# Patient Record
Sex: Male | Born: 1985 | Race: White | Hispanic: No | Marital: Married
Health system: Southern US, Community
[De-identification: ages and names within clinical notes are randomized; demographics above are authoritative.]

---

## 2007-08-20 ENCOUNTER — Ambulatory Visit: Payer: Self-pay | Admitting: Family Medicine

## 2009-06-16 ENCOUNTER — Emergency Department: Payer: Self-pay | Admitting: Emergency Medicine

## 2013-01-03 ENCOUNTER — Ambulatory Visit: Payer: Self-pay | Admitting: Family Medicine

## 2013-04-27 ENCOUNTER — Ambulatory Visit: Payer: Self-pay | Admitting: Physician Assistant

## 2013-04-27 LAB — COMPREHENSIVE METABOLIC PANEL
ALT: 159 U/L — AB (ref 12–78)
Albumin: 3.9 g/dL (ref 3.4–5.0)
Alkaline Phosphatase: 163 U/L — ABNORMAL HIGH
Anion Gap: 6 — ABNORMAL LOW (ref 7–16)
BUN: 16 mg/dL (ref 7–18)
Bilirubin,Total: 0.5 mg/dL (ref 0.2–1.0)
CALCIUM: 9.6 mg/dL (ref 8.5–10.1)
Chloride: 103 mmol/L (ref 98–107)
Co2: 31 mmol/L (ref 21–32)
Creatinine: 1.02 mg/dL (ref 0.60–1.30)
EGFR (African American): >60
Glucose: 103 mg/dL — ABNORMAL HIGH (ref 65–99)
Osmolality: 281 (ref 275–301)
Potassium: 4.4 mmol/L (ref 3.5–5.1)
SGOT(AST): 71 U/L — ABNORMAL HIGH (ref 15–37)
Sodium: 140 mmol/L (ref 136–145)
TOTAL PROTEIN: 8 g/dL (ref 6.4–8.2)

## 2013-04-27 LAB — CBC WITH DIFFERENTIAL/PLATELET
BASOS ABS: 0.1 10*3/uL (ref 0.0–0.1)
Basophil %: 1 %
Eosinophil #: 0.2 10*3/uL (ref 0.0–0.7)
Eosinophil %: 2.5 %
HCT: 43 % (ref 40.0–52.0)
HGB: 14.4 g/dL (ref 13.0–18.0)
Lymphocyte #: 0.9 10*3/uL — ABNORMAL LOW (ref 1.0–3.6)
Lymphocyte %: 14.5 %
MCH: 27.4 pg (ref 26.0–34.0)
MCHC: 33.5 g/dL (ref 32.0–36.0)
MCV: 82 fL (ref 80–100)
MONO ABS: 0.7 x10 3/mm (ref 0.2–1.0)
Monocyte %: 10.7 %
NEUTROS PCT: 71.3 %
Neutrophil #: 4.6 10*3/uL (ref 1.4–6.5)
PLATELETS: 270 10*3/uL (ref 150–440)
RBC: 5.26 10*6/uL (ref 4.40–5.90)
RDW: 13.8 % (ref 11.5–14.5)
WBC: 6.5 10*3/uL (ref 3.8–10.6)

## 2013-04-27 LAB — LIPASE, BLOOD: Lipase: 126 U/L (ref 73–393)

## 2013-04-27 LAB — AMYLASE: Amylase: 64 U/L (ref 25–115)

## 2013-05-03 ENCOUNTER — Ambulatory Visit: Payer: Self-pay | Admitting: Surgery

## 2013-05-04 ENCOUNTER — Ambulatory Visit: Payer: Self-pay | Admitting: Surgery

## 2013-05-07 LAB — PATHOLOGY REPORT

## 2014-04-23 ENCOUNTER — Ambulatory Visit: Payer: Self-pay | Admitting: Physician Assistant

## 2014-05-25 NOTE — Op Note (Signed)
PATIENT NAME:  Henry Cook, EICHEL MR#:  [id number] DATE OF BIRTH:  1985-07-29  DATE OF PROCEDURE:  05/04/2013  PREOPERATIVE DIAGNOSIS: Chronic cholecystitis.   POSTOPERATIVE DIAGNOSIS: Chronic cholecystitis, peritonitis.   PROCEDURE: Laparoscopic cholecystectomy.   SURGEON: Loreli Dollar, MD  ANESTHESIA: General.   INDICATIONS: This 29 year old male has a history of recent epigastric pain which has come on in spells of right upper quadrant pain. Recently had some blood work demonstrating elevation of his alkaline phosphatase, SGOT and SGPT. Ultrasound demonstrated no gallstones. Hepatobiliary scan demonstrated abnormally low gallbladder ejection fraction of 37%. He has continued to have intermittent pains, and surgery was recommended for definitive treatment.   DESCRIPTION OF PROCEDURE: The patient was placed on the operating table in the supine position under general anesthesia. The abdomen was clipped and prepared with ChloraPrep and draped in a sterile manner.   A short incision was made in the inferior aspect of the umbilicus and carried down to the deep fascia, which was grasped with laryngeal hook and elevated. A Veress needle was inserted, aspirated and irrigated with a saline solution. Next, the peritoneal cavity was inflated with carbon dioxide. The Veress needle was removed. The 10 mm cannula was inserted. The 10 mm, 0 degree laparoscope was inserted to view the peritoneal cavity. There was some evidence of inflammation with some adhesions between the liver and the anterior abdominal wall. These appeared to be acute adhesions and came down with insufflation of the abdomen Another incision was made in the epigastrium slightly to the right of the midline to introduce an 11 mm cannula. Two incisions were made in the lateral aspect of the right upper quadrant to introduce two 5 mm cannulas.   The abdomen was further inspected and did note again the adhesions between the liver and the  abdominal wall. The stomach appeared normal. There was no inflammation seen around his duodenum. There did appear to be some thickening of the gallbladder wall. The lower abdomen was inspected and saw no evidence of any inflammation in the lower abdomen. The cecum appeared normal. It appeared that the appendix was likely retrocecal, as with traction on the cecum, I did not see the appendix, but saw no inflammation in this area. The omentum was retracted, exposing portions of intestine and sigmoid colon, which appeared normal. The scope was again directed towards the gallbladder, which was retracted towards the right shoulder. Some adhesions were taken down surrounding the gallbladder. The infundibulum was retracted inferiorly and laterally. The porta hepatis was demonstrated and appeared typical. The gallbladder neck was mobilized with incision of the visceral peritoneum. The cystic duct was dissected free from surrounding structures. The cystic artery was dissected free from surrounding structures. A critical view of safety was demonstrated. An Endo Clip was placed across the cystic duct adjacent to the neck of the gallbladder. An incision was made in the cystic duct. There was some scant bleeding which was cauterized. The Reddick catheter was inserted, and it only went in just a few millimeters into the cystic duct. It appeared that the cystic duct was small in size, and the catheter would not thread. Therefore, a cholangiogram was not done. The Reddick catheter was removed. The cystic duct was doubly ligated with End Clips and divided. The cystic artery was controlled with single Endo Clips, one proximally and one distally, and divided. The gallbladder appeared to have some thickening in the gallbladder wall and was dissected free from surrounding liver and was completely separated. Hemostasis was intact. The  gallbladder was delivered out through the infraumbilical incision, opened and suctioned and removed and  sent for routine pathology. The right upper quadrant was further inspected. It is noted that the small areas of inflammation on the surface of the liver appeared to have some white fibrinous appearance. There was no nodularity seen in the liver as the liver surface was otherwise smooth. Next, the laparoscopic ports were removed. Carbon dioxide was allowed to escape from the peritoneal cavity. Several small subcutaneous bleeding points were cauterized. The skin incisions were closed with interrupted 5-0 chromic subcuticular suture, benzoin and Steri-Strips. Dressings were applied with paper tape. The patient tolerated surgery satisfactorily and was prepared for transfer to the recovery room.   ____________________________ Lenna Sciara. Rochel Brome, MD jws:lb D: 05/04/2013 10:11:24 ET T: 05/04/2013 11:24:15 ET JOB#: [id number]  cc: Loreli Dollar, MD, <Dictator> Loreli Dollar MD ELECTRONICALLY SIGNED 05/15/2013 8:49

## 2015-10-27 IMAGING — CR LEFT LITTLE FINGER 2+V
3 series · 3 of 3 positions shown · non-contrast
Comparison: None.

CLINICAL DATA: Fell dislocating the left fifth finger, persistent
pain

EXAM:
LEFT LITTLE FINGER 2+V

[finger ap]
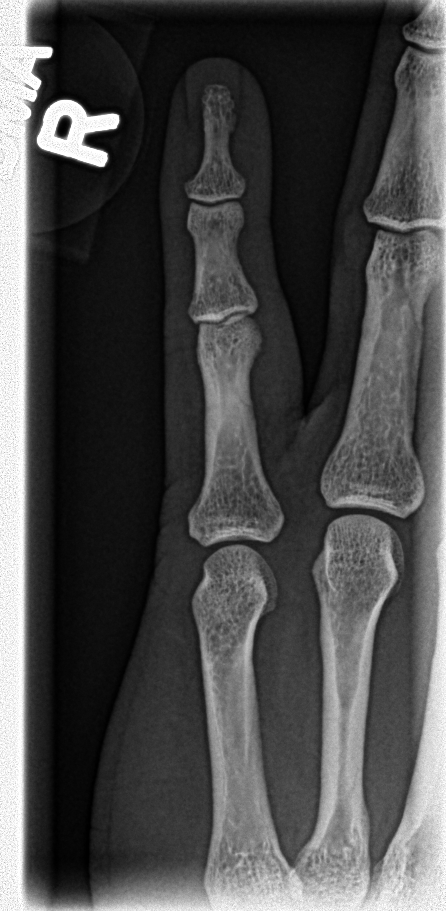

[finger obl]
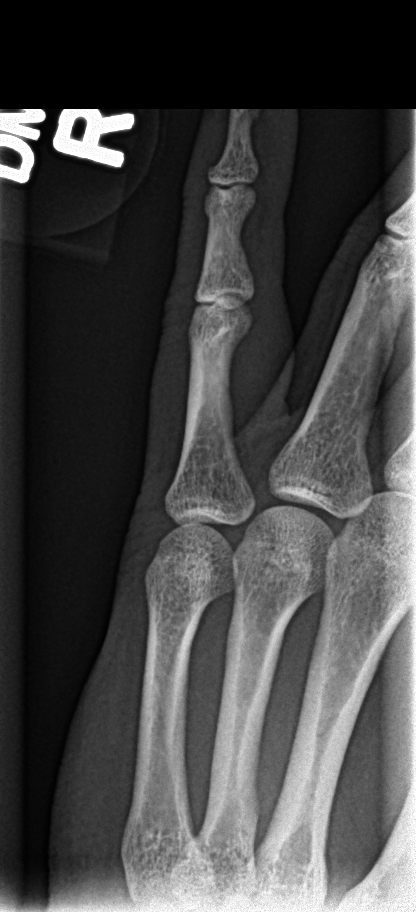

[finger lat]
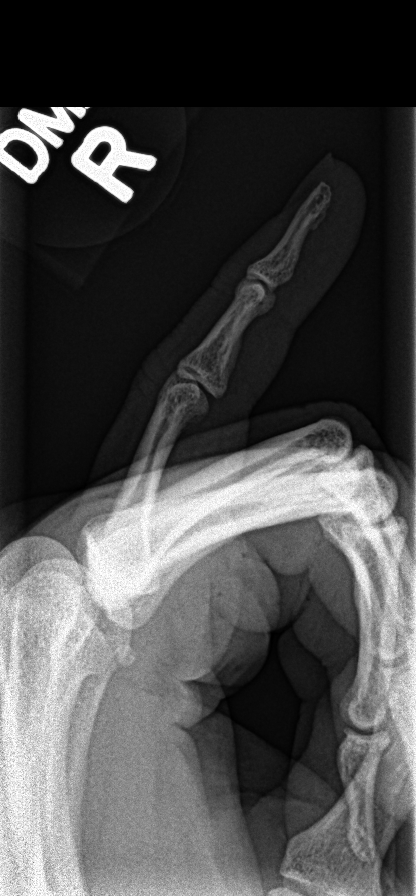

[3 of 3 positions shown; findings below may reference images not displayed]

FINDINGS: No acute fracture is seen. Alignment is normal. Joint spaces appear
normal.
IMPRESSION: No acute fracture is seen.  Normal alignment.

## 2017-10-27 DIAGNOSIS — D485 Neoplasm of uncertain behavior of skin: Secondary | ICD-10-CM | POA: Diagnosis not present

## 2017-11-25 DIAGNOSIS — D225 Melanocytic nevi of trunk: Secondary | ICD-10-CM | POA: Diagnosis not present

## 2017-11-25 DIAGNOSIS — D485 Neoplasm of uncertain behavior of skin: Secondary | ICD-10-CM | POA: Diagnosis not present

## 2018-03-18 DIAGNOSIS — R05 Cough: Secondary | ICD-10-CM | POA: Diagnosis not present

## 2018-03-29 DIAGNOSIS — R05 Cough: Secondary | ICD-10-CM | POA: Diagnosis not present

## 2018-04-24 DIAGNOSIS — J069 Acute upper respiratory infection, unspecified: Secondary | ICD-10-CM | POA: Diagnosis not present

## 2018-05-18 DIAGNOSIS — Z03818 Encounter for observation for suspected exposure to other biological agents ruled out: Secondary | ICD-10-CM | POA: Diagnosis not present

## 2019-10-18 ENCOUNTER — Other Ambulatory Visit: Payer: Self-pay | Admitting: Gerontology

## 2019-10-29 ENCOUNTER — Other Ambulatory Visit: Payer: Self-pay | Admitting: Gerontology

## 2019-10-29 DIAGNOSIS — I10 Essential (primary) hypertension: Secondary | ICD-10-CM

## 2019-11-07 ENCOUNTER — Ambulatory Visit
Admission: RE | Admit: 2019-11-07 | Discharge: 2019-11-07 | Disposition: A | Discharge status: Home / Self Care | Payer: 59 | Source: Ambulatory Visit | Attending: Gerontology | Admitting: Gerontology

## 2019-11-07 ENCOUNTER — Other Ambulatory Visit: Payer: Self-pay

## 2019-11-07 DIAGNOSIS — I10 Essential (primary) hypertension: Secondary | ICD-10-CM | POA: Diagnosis present

## 2021-05-12 IMAGING — US US AORTA SCREENING (MEDICARE)
1 series · 14 of 22 positions shown · non-contrast
Comparison: None.

CLINICAL DATA: Family history of abdominal aortic aneurysm (father,
uncle and aunt)

EXAM:
ULTRASOUND OF ABDOMINAL AORTA
TECHNIQUE: Ultrasound examination of the abdominal aorta and proximal common
iliac arteries was performed to evaluate for aneurysm. Additional
color and Doppler images of the distal aorta were obtained to
document patency.

[Series 1: us aorta screening (medicare) · 0.26mm/px · 14 of 22 slices shown]
[im 1/22]
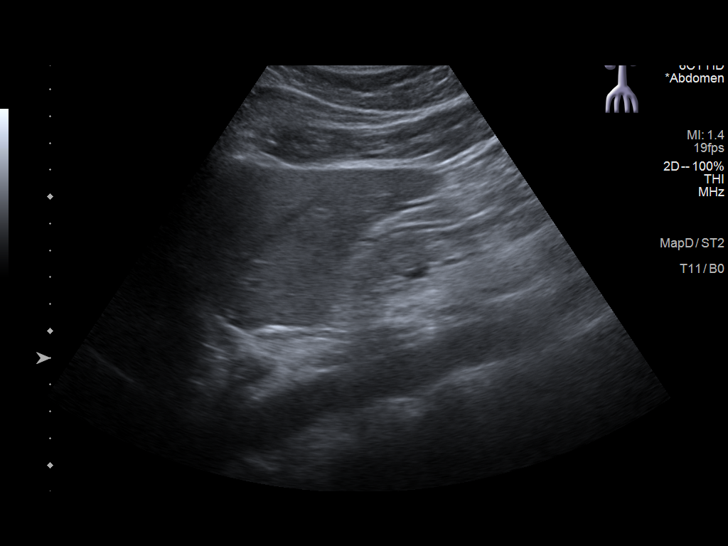
[im 3/22]
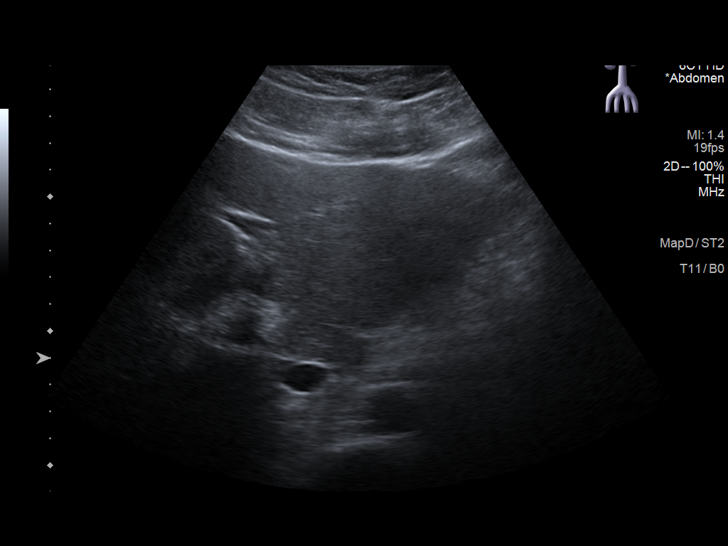
[im 4/22]
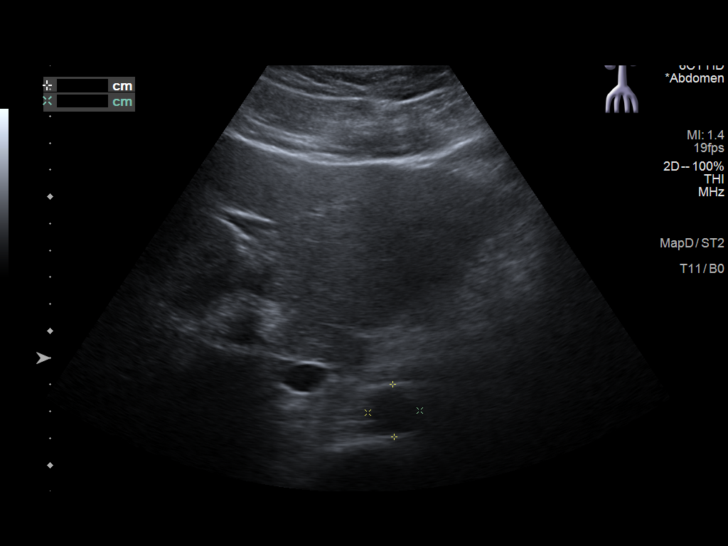
[im 6/22]
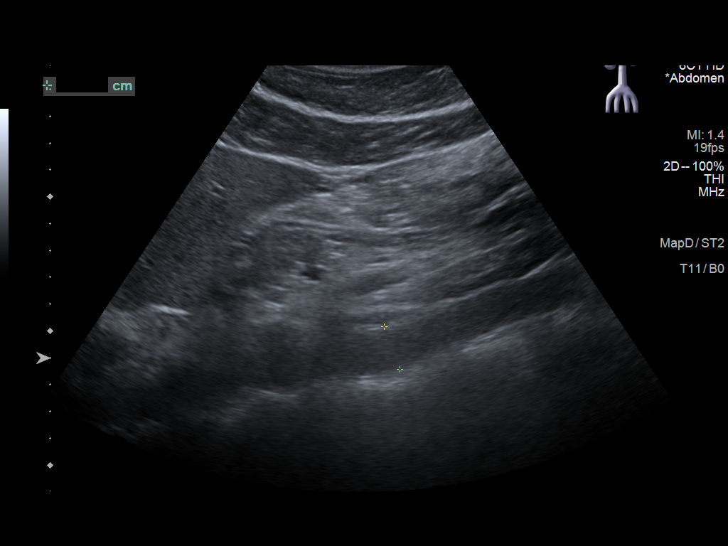
[im 8/22]
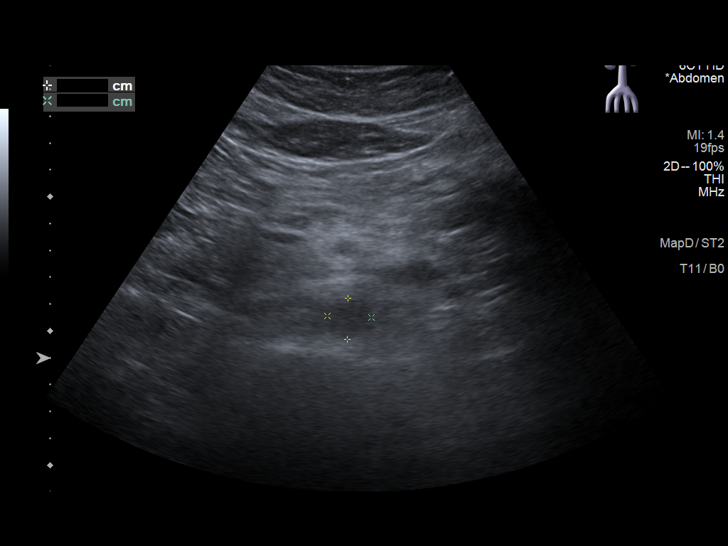
[im 9/22]
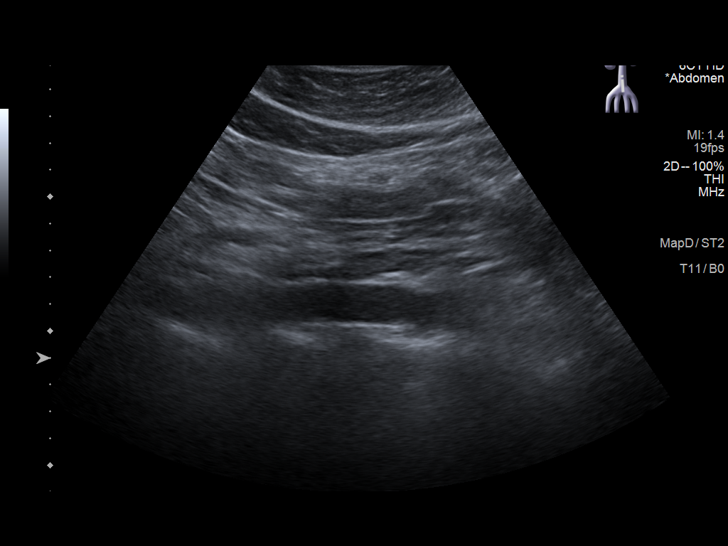
[im 11/22]
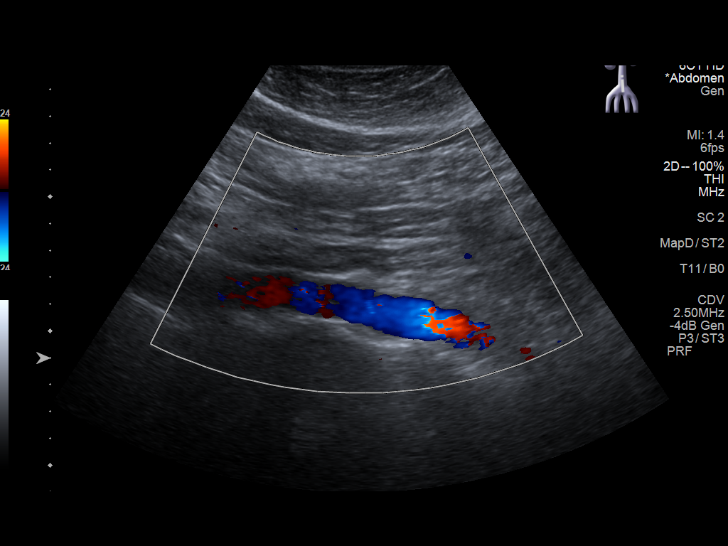
[im 12/22]
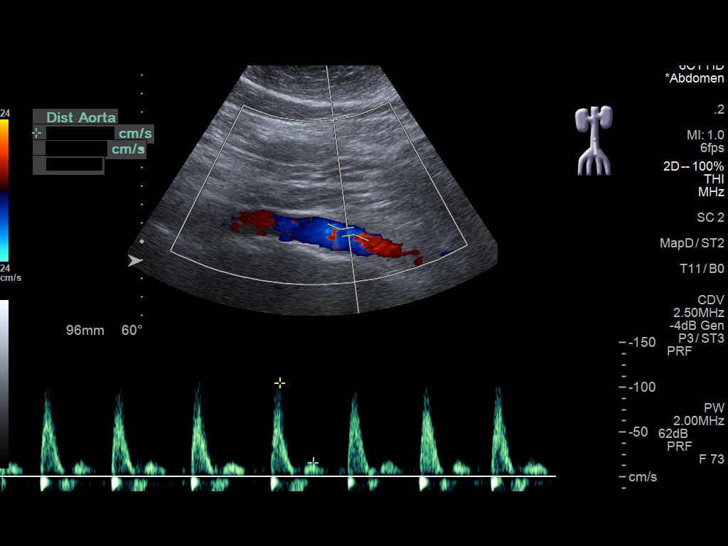
[im 14/22]
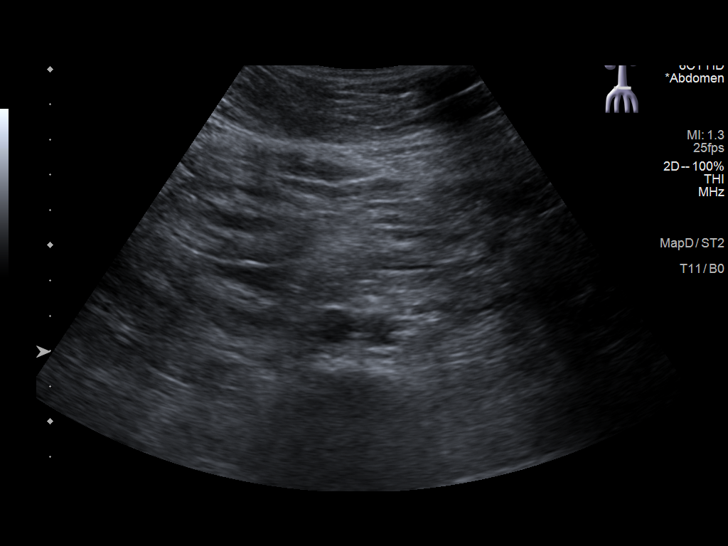
[im 15/22]
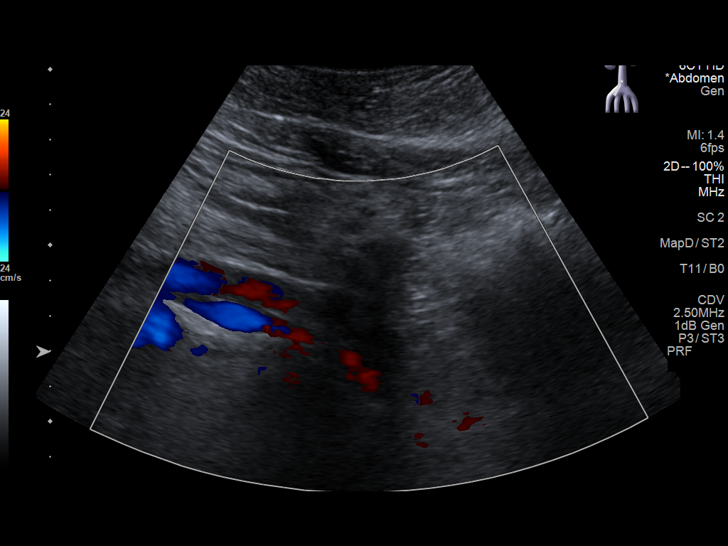
[im 17/22]
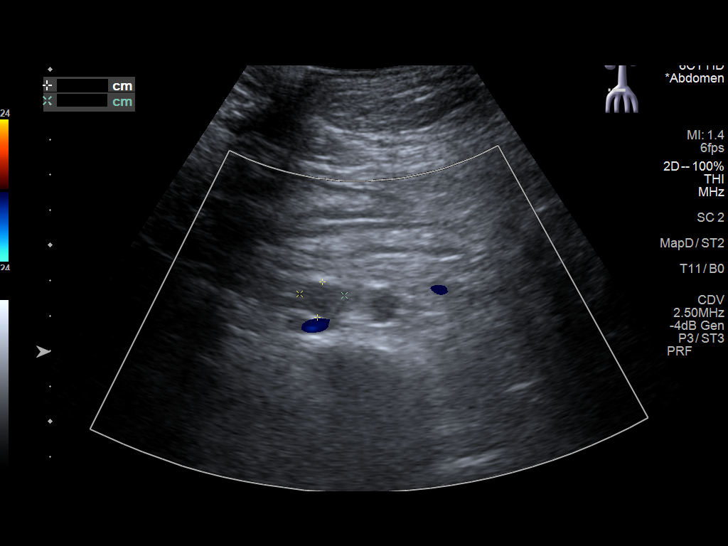
[im 19/22]
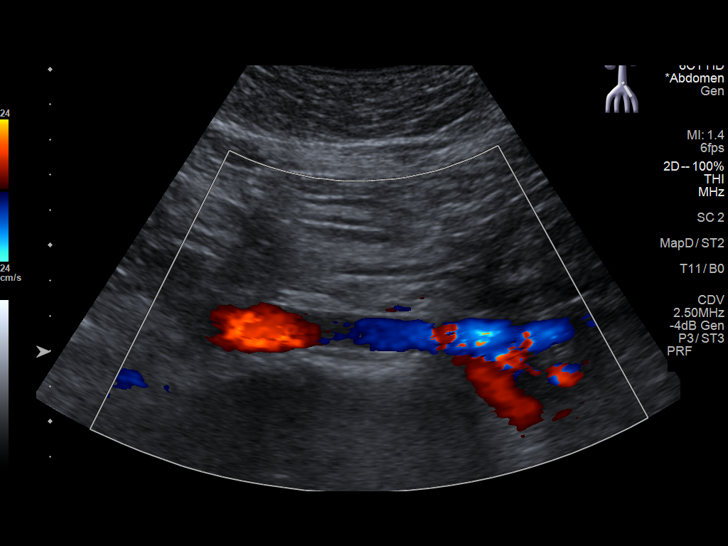
[im 20/22]
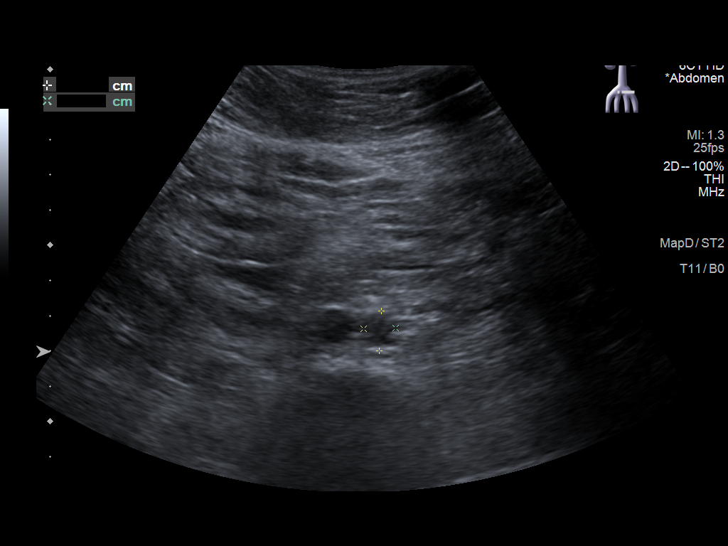
[im 22/22]
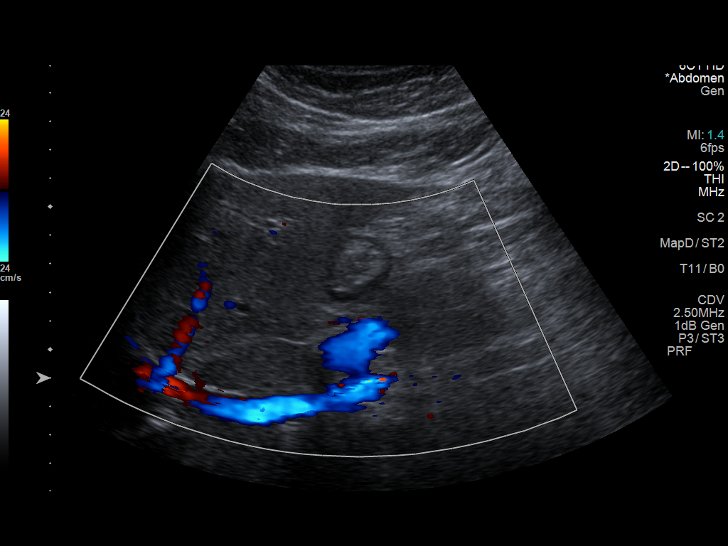

[14 of 22 positions shown; findings below may reference images not displayed]

FINDINGS: Abdominal aortic measurements as follows:

Proximal:  2.0 x 1.9 cm

Mid:  1.6 x 1.5 cm

Distal:  1.5 x 1.4 cm
Patent: Yes, peak systolic velocity is 105 cm/s

Right common iliac artery: 1.3 x 1.0 cm

Left common iliac artery: 1.1 x 0.9 cm
IMPRESSION: No evidence of abdominal aortic aneurysm.
